# Patient Record
Sex: Female | Born: 2000 | Race: Asian | Hispanic: No | Marital: Single | Smoking: Never smoker
Health system: Southern US, Community
[De-identification: ages and names within clinical notes are randomized; demographics above are authoritative.]

## PROBLEM LIST (undated history)

## (undated) DIAGNOSIS — Z789 Other specified health status: Secondary | ICD-10-CM

## (undated) HISTORY — PX: NO PAST SURGERIES: SHX2092

---
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
## 2022-01-05 ENCOUNTER — Telehealth (HOSPITAL_COMMUNITY): Payer: Self-pay

## 2022-01-05 ENCOUNTER — Other Ambulatory Visit: Payer: Self-pay

## 2022-01-05 ENCOUNTER — Emergency Department (HOSPITAL_BASED_OUTPATIENT_CLINIC_OR_DEPARTMENT_OTHER)
Admission: EM | Admit: 2022-01-05 | Discharge: 2022-01-05 | Disposition: A | Discharge status: Home / Self Care | Payer: Medicaid Other | Attending: Emergency Medicine | Admitting: Emergency Medicine

## 2022-01-05 ENCOUNTER — Encounter (HOSPITAL_BASED_OUTPATIENT_CLINIC_OR_DEPARTMENT_OTHER): Payer: Self-pay | Admitting: Emergency Medicine

## 2022-01-05 DIAGNOSIS — G510 Bell's palsy: Secondary | ICD-10-CM | POA: Insufficient documentation

## 2022-01-05 DIAGNOSIS — R29810 Facial weakness: Secondary | ICD-10-CM | POA: Diagnosis present

## 2022-01-05 MED ORDER — ERYTHROMYCIN 5 MG/GM OP OINT: TOPICAL_OINTMENT | OPHTHALMIC | 0 refills | Status: AC

## 2022-01-05 MED ORDER — VALACYCLOVIR HCL 1 G PO TABS: 1000.0000 mg | ORAL_TABLET | Freq: Two times a day (BID) | ORAL | 0 refills | Status: AC

## 2022-01-05 MED ORDER — PREDNISONE 50 MG PO TABS
60.0000 mg | ORAL_TABLET | Freq: Once | ORAL | Status: AC
  Administered 2022-01-05: 60 mg via ORAL
  Filled 2022-01-05: qty 1

## 2022-01-05 MED ORDER — VALACYCLOVIR HCL 500 MG PO TABS
1000.0000 mg | ORAL_TABLET | ORAL | Status: AC
  Administered 2022-01-05: 1000 mg via ORAL
  Filled 2022-01-05: qty 2

## 2022-01-05 MED ORDER — PREDNISONE 50 MG PO TABS: ORAL_TABLET | ORAL | 0 refills | Status: AC

## 2022-01-05 NOTE — ED Provider Notes (Addendum)
[facility] EMERG[facility] DEPT Provider Note   CSN: [id number] Arrival date & time: 01/05/22  0026     History Chief Complaint  Patient presents with   Facial Droop    Debbie Mccormick is a 21 y.o. female.  The history is provided by the patient.  Illness Location:  Left face Quality:  Droop and unable to raise left eyebrow Severity:  Moderate Onset quality:  Sudden Duration:  1 week Timing:  Constant Progression:  Unchanged Chronicity:  New Context:  Recently had a baby Relieved by:  Nothing Worsened by:  Nothing Ineffective treatments:  None tried Associated symptoms: no abdominal pain, no chest pain, no congestion, no cough, no diarrhea, no ear pain, no fever, no headaches, no loss of consciousness, no myalgias, no nausea, no rhinorrhea, no shortness of breath, no sore throat and no vomiting   Risk factors:  None    Past Medical History:  Diagnosis Date   Medical history non-contributory      Home Medications Prior to Admission medications   Medication Sig Start Date End Date Taking? Authorizing Provider  predniSONE (DELTASONE) 50 MG tablet 1 tablet each days x 6 days 01/05/22  Yes Shannen Flansburg, MD  valACYclovir (VALTREX) 1000 MG tablet Take 1 tablet (1,000 mg total) by mouth 2 (two) times daily. 01/05/22  Yes Seraphina Mitchner, MD  albuterol (PROVENTIL) (2.5 MG/3ML) 0.083% nebulizer solution Take 3 mLs (2.5 mg total) by nebulization every 6 (six) hours. 10/30/21   Holshouser, Theone Murdoch, CNM  ibuprofen (ADVIL) 600 MG tablet Take 1 tablet (600 mg total) by mouth every 6 (six) hours. 12/24/21   Holshouser, Theone Murdoch, CNM  oxyCODONE-acetaminophen (PERCOCET/ROXICET) 5-325 MG tablet Take 1-2 tablets by mouth every 4 (four) hours as needed for severe pain. 12/24/21   Holshouser, Theone Murdoch, CNM  Prenatal Vit-Fe Fumarate-FA (PRENATAL VITAMINS) 28-0.8 MG TABS Take 1 tablet by mouth daily. 04/24/21   [provider]      Allergies    Patient has no known  allergies.    Review of Systems   Review of Systems  Constitutional:  Negative for fever.  HENT:  Negative for congestion, drooling, ear pain, facial swelling, rhinorrhea and sore throat.   Eyes:  Negative for photophobia and visual disturbance.  Respiratory:  Negative for cough and shortness of breath.   Cardiovascular:  Negative for chest pain.  Gastrointestinal:  Negative for abdominal pain, diarrhea, nausea and vomiting.  Musculoskeletal:  Negative for arthralgias and myalgias.  Neurological:  Positive for facial asymmetry. Negative for dizziness, loss of consciousness and headaches.  Psychiatric/Behavioral:  Negative for agitation.   All other systems reviewed and are negative.  Physical Exam Updated Vital Signs BP 129/76 (BP Location: Left Arm)    Pulse 82    Temp 98.3 F (36.8 C) (Oral)    Resp 18    Ht 5\' 2"  (1.575 m)    Wt 73.5 kg    LMP  (LMP Unknown)    SpO2 100%    Breastfeeding No    BMI 29.63 kg/m  Physical Exam Vitals and nursing note reviewed.  Constitutional:      Appearance: Normal appearance.  HENT:     Head: Normocephalic.     Nose: Nose normal.     Mouth/Throat:     Mouth: Mucous membranes are moist.     Pharynx: Oropharynx is clear.  Eyes:     Extraocular Movements: Extraocular movements intact.     Conjunctiva/sclera: Conjunctivae normal.     Pupils:  Pupils are equal, round, and reactive to light.  Cardiovascular:     Rate and Rhythm: Normal rate and regular rhythm.     Pulses: Normal pulses.     Heart sounds: Normal heart sounds.  Pulmonary:     Effort: Pulmonary effort is normal.     Breath sounds: Normal breath sounds.  Abdominal:     General: Abdomen is flat. Bowel sounds are normal.     Palpations: Abdomen is soft.     Tenderness: There is no abdominal tenderness. There is no guarding.  Musculoskeletal:        General: Normal range of motion.     Cervical back: Normal range of motion and neck supple.  Skin:    General: Skin is warm and dry.      Capillary Refill: Capillary refill takes less than 2 seconds.  Neurological:     Mental Status: She is alert and oriented to person, place, and time.     Motor: No weakness.     Gait: Gait normal.     Deep Tendon Reflexes: Reflexes normal.     Comments: Peripheral nerve 7 palsy, droop of the mouth on left, inability to tightly close eye on left unable to raise left eyebrow or wrinkle left forehead at all all other cranial nerves are normal   Psychiatric:        Mood and Affect: Mood normal.        Behavior: Behavior normal.    ED Results / Procedures / Treatments   Labs (all labs ordered are listed, but only abnormal results are displayed) Labs Reviewed - No data to display  EKG None  Radiology No results found.   Medications Ordered in ED Medications  predniSONE (DELTASONE) tablet 60 mg (has no administration in time range)  valACYclovir (VALTREX) tablet 1,000 mg (has no administration in time range)    ED Course/ Medical Decision Making/ A&P                           Medical Decision Making Patient with left facial droop and inability to raise left eyebrow.    Risk Prescription drug management. Decision regarding hospitalization. Risk Details: I considered hospitalization and CT scan when I saw the complaint but I examined the patient and the patient has no ability to raise her left eyebrow and no ability to wrinkle her forehead on the left, this is indicated of a peripheral nerve 7 palsy and not central as would be seen in stroke.  He exam is consistent with Bells palsy.  She has no additional signs or symptoms or stroke. She has no pain or headachek nor neck pain to suggest alternative etiology.  EOMI are intact.  BP is normal.  Patient is not diabetic.  I will start Valtrex, steroids and rewetting drops, emycin ointment and have patient follow up with both her PMD and ophthalmology to ensure eyelid symptoms resolve.      Final Clinical Impression(s) / ED  Diagnoses Final diagnoses:  Bell's palsy  Return for intractable cough, coughing up blood, fevers > 100.4 unrelieved by medication, shortness of breath, intractable vomiting, chest pain, shortness of breath, weakness, numbness, changes in speech, facial asymmetry, abdominal pain, passing out, Inability to tolerate liquids or food, cough, altered mental status or any concerns. No signs of systemic illness or infection. The patient is nontoxic-appearing on exam and vital signs are within normal limits.  I have reviewed the triage vital signs  and the nursing notes. Pertinent labs & imaging results that were available during my care of the patient were reviewed by me and considered in my medical decision making (see chart for details). After history, exam, and medical workup I feel the patient has been appropriately medically screened and is safe for discharge home. Pertinent diagnoses were discussed with the patient. Patient was given return precautions.    Rx / DC Orders ED Discharge Orders          Ordered    predniSONE (DELTASONE) 50 MG tablet        01/05/22 0346    valACYclovir (VALTREX) 1000 MG tablet  2 times daily        01/05/22 0346                Keamber Macfadden, MD 01/05/22 [id number]

## 2022-01-05 NOTE — ED Triage Notes (Signed)
°  Patient comes in with L sided facial droop that started a week ago.  Patient is two weeks postpartum and received epidural.  No other neurological symptoms other than facial droop.  Patient states she has no pain and only eye irritation from not being able to blink completely with L eye.

## 2022-01-05 NOTE — Telephone Encounter (Signed)
"  I have been doing great. I went to the ER yesterday. The said I am having a nerve problem in my face. The doctor said that I am ok and not to worry." RN told patient to follow treatment recommendations from the doctor and if symptoms are to [facility], or if she has concerns to reach out to her provider or come to hospital to be seen again. "My dressing is off of my c-section incision." Patient declines having incision check with her OB yet. RN told patient to call OB office and schedule incision check as her dc note from delivery says they would like to see her for a 1 wk pp incision check. RN also reviewed incision care and signs of infection to report to the provider. Patient has other concerns or questions.  "He's great. He is doing well. Eating and gaining weight. He sleeps in a crib." RN reviewed ABC's of safe sleep with patient. Patient declines any questions or concerns about baby.  EPDS score is 2.  Marcelino Duster [facility] 01/05/2022,1158

## 2022-05-29 IMAGING — DX DG CHEST 1V PORT
1 series · 1 of 1 positions shown · non-contrast
Comparison: None.

CLINICAL DATA: Shortness of breath, cough, fever.

EXAM:
PORTABLE CHEST 1 VIEW

[chest]
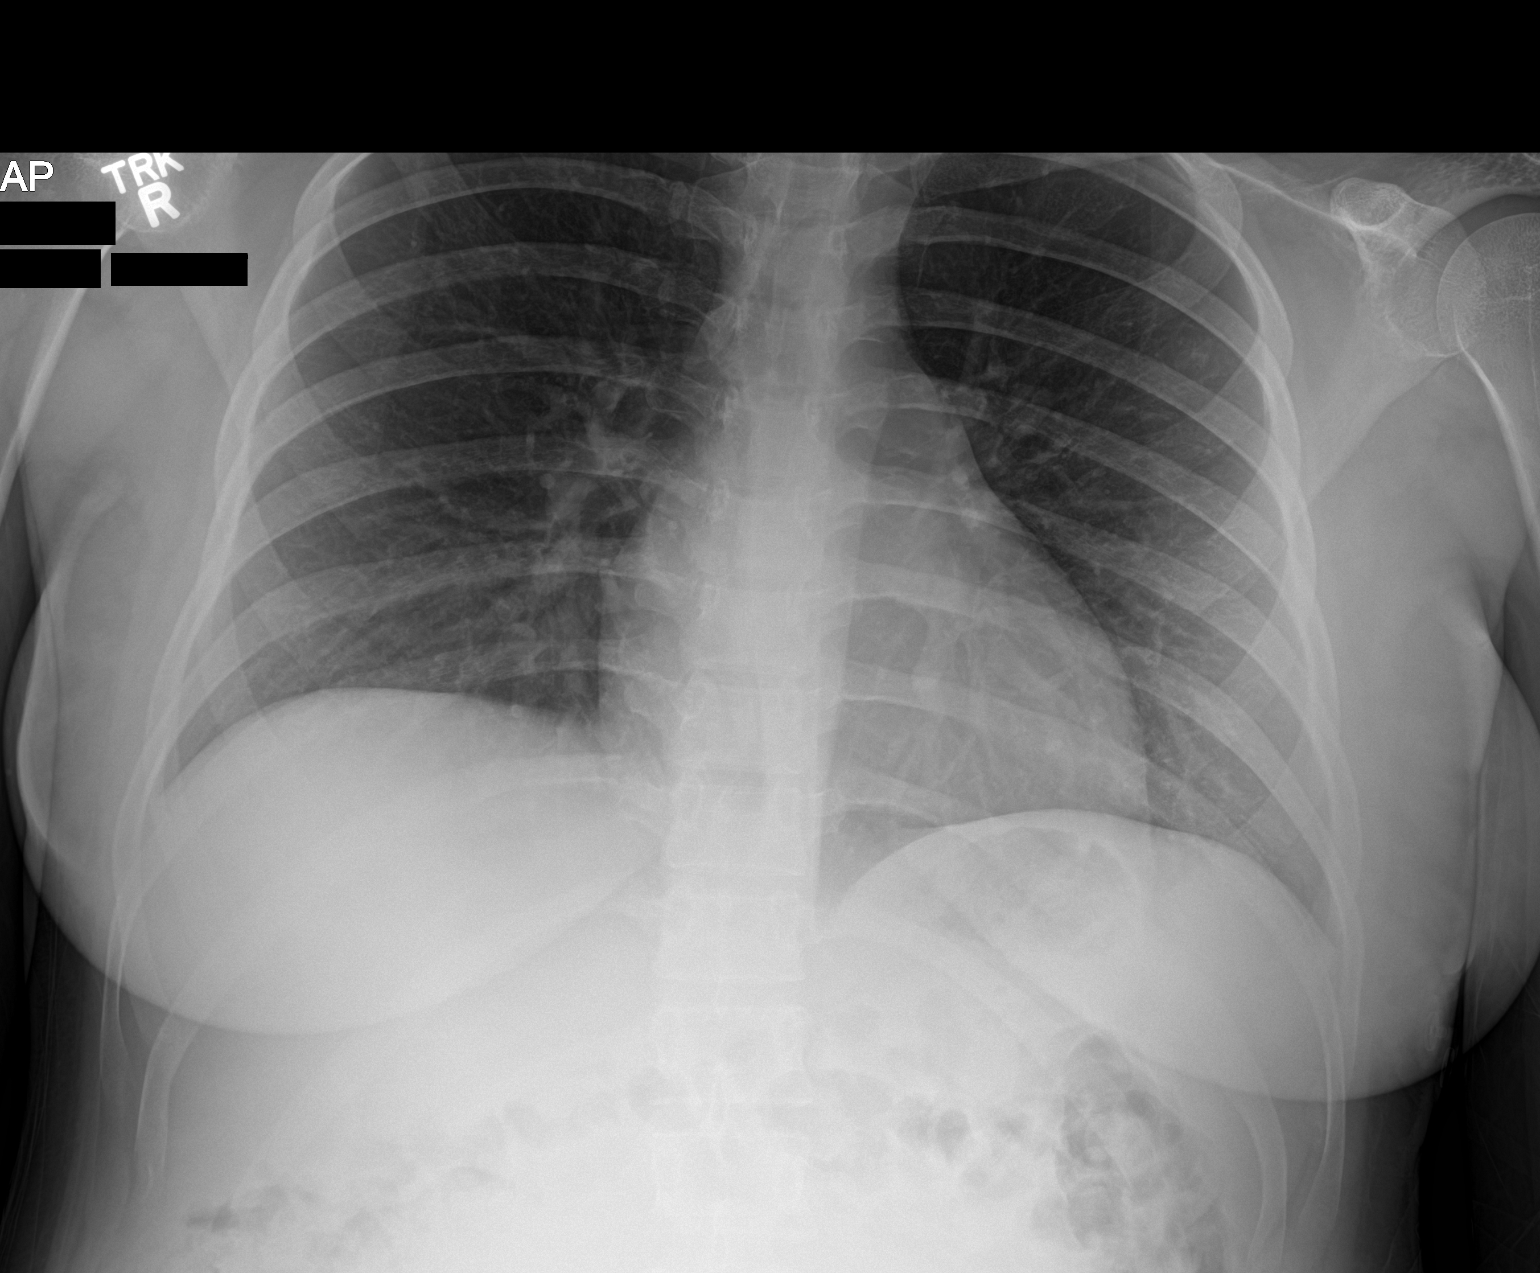

[1 of 1 positions shown; findings below may reference images not displayed]

FINDINGS: The heart size and mediastinal contours are within normal limits.
Both lungs are clear. The visualized skeletal structures are
unremarkable.
IMPRESSION: No active disease.

## 2022-10-01 ENCOUNTER — Other Ambulatory Visit: Payer: Self-pay | Admitting: Internal Medicine

## 2022-10-02 LAB — IRON, TOTAL/TOTAL IRON BINDING CAP
%SAT: 11 % (calc) — ABNORMAL LOW (ref 15–45)
Iron: 38 ug/dL (ref 27–164)
TIBC: 335 mcg/dL (calc) (ref 271–448)

## 2022-10-02 LAB — BASIC METABOLIC PANEL WITH GFR
BUN: 12 mg/dL (ref 7–20)
CO2: 22 mmol/L (ref 20–32)
Calcium: 9.3 mg/dL (ref 8.9–10.4)
Chloride: 106 mmol/L (ref 98–110)
Creat: 0.61 mg/dL (ref 0.50–0.96)
Glucose, Bld: 90 mg/dL (ref 65–99)
Potassium: 4.2 mmol/L (ref 3.8–5.1)
Sodium: 139 mmol/L (ref 135–146)
eGFR: 132 mL/min/{1.73_m2} (ref >=60)

## 2022-10-02 LAB — VITAMIN B12: Vitamin B-12: 727 pg/mL (ref 200–1100)

## 2022-10-02 LAB — FOLATE: Folate: 17.4 ng/mL

## 2022-10-02 LAB — CBC
HCT: 38.6 % (ref 35.0–45.0)
Hemoglobin: 12.6 g/dL (ref 11.7–15.5)
MCH: 25.6 pg — ABNORMAL LOW (ref 27.0–33.0)
MCHC: 32.6 g/dL (ref 32.0–36.0)
MCV: 78.3 fL — ABNORMAL LOW (ref 80.0–100.0)
MPV: 11.1 fL (ref 7.5–12.5)
Platelets: 382 10*3/uL (ref 140–400)
RBC: 4.93 10*6/uL (ref 3.80–5.10)
RDW: 13.1 % (ref 11.0–15.0)
WBC: 6 10*3/uL (ref 3.8–10.8)

## 2022-10-02 LAB — VITAMIN D 25 HYDROXY (VIT D DEFICIENCY, FRACTURES): Vit D, 25-Hydroxy: 5 ng/mL — ABNORMAL LOW (ref 30–100)

## 2022-10-02 LAB — SICKLE CELL SCREEN: Sickle Solubility Test - HGBRFX: NEGATIVE

## 2022-10-02 LAB — FERRITIN: Ferritin: 24 ng/mL (ref 16–154)

## 2023-01-09 ENCOUNTER — Encounter: Payer: Self-pay | Admitting: Gastroenterology

## 2023-02-14 ENCOUNTER — Other Ambulatory Visit: Payer: Medicaid Other

## 2023-02-14 ENCOUNTER — Ambulatory Visit (INDEPENDENT_AMBULATORY_CARE_PROVIDER_SITE_OTHER): Payer: Medicaid Other | Admitting: Gastroenterology

## 2023-02-14 ENCOUNTER — Encounter: Payer: Self-pay | Admitting: Gastroenterology

## 2023-02-14 VITALS — BP 118/76 | HR 80 | Ht 63.0 in | Wt 139.0 lb

## 2023-02-14 DIAGNOSIS — R140 Abdominal distension (gaseous): Secondary | ICD-10-CM

## 2023-02-14 DIAGNOSIS — R1012 Left upper quadrant pain: Secondary | ICD-10-CM

## 2023-02-14 NOTE — Patient Instructions (Addendum)
_______________________________________________________  If your blood pressure at your visit was 140/90 or greater, please contact your primary care physician to follow up on this.  _______________________________________________________  If you are age 20 or older, your body mass index should be between 23-30. Your Body mass index is 24.62 kg/m. If this is out of the aforementioned range listed, please consider follow up with your Primary Care Provider.  If you are age 4 or younger, your body mass index should be between 19-25. Your Body mass index is 24.62 kg/m. If this is out of the aformentioned range listed, please consider follow up with your Primary Care Provider.   Your provider has requested that you go to the basement level for lab work before leaving today. Press "B" on the elevator. The lab is located at the first door on the left as you exit the elevator.  Please purchase Metamucil over the counter. Take as directed.   ________________________________________________________  The [facility] providers would like to encourage you to use [facility] to communicate with providers for non-urgent requests or questions.  Due to long hold times on the telephone, sending your provider a message by [facility] may be a faster and more efficient way to get a response.  Please allow 48 business hours for a response.  Please remember that this is for non-urgent requests.   It was a pleasure to see you today!  Thank you for trusting me with your gastrointestinal care!    Scott E.Candis Schatz, MD

## 2023-02-14 NOTE — Progress Notes (Unsigned)
HPI : Debbie Mccormick is a very pleasant 22 year old female with no chronic medical history who is referred to [place] by Dr. Nolene Ebbs for further evaluation of abdominal discomfort and bloating.  She has been having these symptoms for several months now.  She describes pain in her upper abdomen and significant bloating and distention.  The pain localizes more to the left upper quadrant and is described as a pressure-like sensation which can last for hours.  It is experienced several days per week. No nausea or vomiting.  No heartburn, acid regurgitation  or dysphagia.   Her weight is stable.   These symptoms are typically noticed after eating and she has noticed bread often makes her symptoms; otherwise she denies any other foods causing worse symptoms.  She typically has regular bowel movements with formed brown stools but sometimes has constipation She takes an OTC laxative called  Colon Cleanse tablet as needed which helps with constipation.  She does not take any other prescriptions or OTC medications.  No blood in her stool.  She denies any changes in her diet preceding her symptoms.  No recent antibiotics.  No NSAIDs.  A faxed clinic note from her referring provider references a normal ultrasound, but the patient states that this ultrasound was several years ago.  No previous H. Pylori testing or treatment.  Her MCV was slightly low with low iron saturation, but normal Hgb and ferritin. She denies any family history of chronic GI conditions such as celiac disease.   Past Medical History:  Diagnosis Date   Medical history non-contributory      Past Surgical History:  Procedure Laterality Date   CESAREAN SECTION N/A 12/22/2021   Procedure: CESAREAN SECTION;  Surgeon: Sanjuana Kava, MD;  Location: [facility] LD ORS;  Service: Obstetrics;  Laterality: N/A;   NO PAST SURGERIES     Family History  Problem Relation Age of Onset   Healthy Mother    Healthy Father    Liver disease Neg Hx     Esophageal cancer Neg Hx    Colon cancer Neg Hx    Social History   Tobacco Use   Smoking status: Never   Smokeless tobacco: Never  Vaping Use   Vaping Use: Never used  Substance Use Topics   Alcohol use: No   Drug use: No   Current Outpatient Medications  Medication Sig Dispense Refill   albuterol (PROVENTIL) (2.5 MG/3ML) 0.083% nebulizer solution Take 3 mLs (2.5 mg total) by nebulization every 6 (six) hours. 75 mL 0   erythromycin ophthalmic ointment Place a 1/2 inch ribbon of ointment into the lower eyelid BID 3.5 g 0   ibuprofen (ADVIL) 600 MG tablet Take 1 tablet (600 mg total) by mouth every 6 (six) hours. 30 tablet 0   predniSONE (DELTASONE) 50 MG tablet 1 tablet each days x 6 days 6 tablet 0   Prenatal Vit-Fe Fumarate-FA (PRENATAL VITAMINS) 28-0.8 MG TABS Take 1 tablet by mouth daily.     valACYclovir (VALTREX) 1000 MG tablet Take 1 tablet (1,000 mg total) by mouth 2 (two) times daily. 14 tablet 0   oxyCODONE-acetaminophen (PERCOCET/ROXICET) 5-325 MG tablet Take 1-2 tablets by mouth every 4 (four) hours as needed for severe pain. (Patient not taking: Reported on 02/14/2023) 30 tablet 0   No current facility-administered medications for this visit.   No Known Allergies   Review of Systems: All systems reviewed and negative except where noted in HPI.    No results found.  Physical Exam: BP 118/76   Pulse 80   Ht 5\' 3"  (1.6 m)   Wt 139 lb (63 kg)   SpO2 95%   BMI 24.62 kg/m  Constitutional: Pleasant,well-developed, [place] female in no acute distress.  Accompanied by husband and infant son HEENT: Normocephalic and atraumatic. Conjunctivae are normal. No scleral icterus. Cardiovascular: Normal rate, regular rhythm.  Pulmonary/chest: Effort normal and breath sounds normal. No wheezing, rales or rhonchi. Abdominal: Soft, nondistended, multifocal tenderness to palpation in the epigastrium, LUQ and LLQ without rigidity or guarding. Bowel sounds active throughout.  There are no masses palpable. No hepatomegaly. Extremities: no edema Neurological: Alert and oriented to person place and time. Skin: Skin is warm and dry. No rashes noted. Psychiatric: Normal mood and affect. Behavior is normal.  CBC    Component Value Date/Time   WBC 6.0 10/01/2022 1608   RBC 4.93 10/01/2022 1608   HGB 12.6 10/01/2022 1608   HCT 38.6 10/01/2022 1608   PLT 382 10/01/2022 1608   MCV 78.3 (L) 10/01/2022 1608   MCH 25.6 (L) 10/01/2022 1608   MCHC 32.6 10/01/2022 1608   RDW 13.1 10/01/2022 1608   LYMPHSABS 0.8 10/28/2021 0824   MONOABS 0.9 10/28/2021 0824   EOSABS 0.0 10/28/2021 0824   BASOSABS 0.0 10/28/2021 0824    CMP     Component Value Date/Time   NA 139 10/01/2022 1608   K 4.2 10/01/2022 1608   CL 106 10/01/2022 1608   CO2 22 10/01/2022 1608   GLUCOSE 90 10/01/2022 1608   BUN 12 10/01/2022 1608   CREATININE 0.61 10/01/2022 1608   CALCIUM 9.3 10/01/2022 1608   PROT 5.2 (L) 10/29/2021 0755   ALBUMIN 2.4 (L) 10/29/2021 0755   AST 37 10/29/2021 0755   ALT 23 10/29/2021 0755   ALKPHOS 104 10/29/2021 0755   BILITOT 0.5 10/29/2021 0755   GFRNONAA >60 10/29/2021 0755     ASSESSMENT AND PLAN:  22 year old female with several months of bloating and post prandial pain with occasional constipation.  She notes that bread typically worsens her symptoms.    No red flag symptoms, no family history.  Will check for H. Pylori and celiac disease.  Recommended daily fiber supplement to improve stool bulk and reduce constipation which can sometimes improve bloating.  Provided samples of IBGard.  We discussed the pathophysiology of intestinal gas as well as gut-brain axis disorders. If H. Pylori and celiac serologies negative and symptoms not improving with fiber, will discuss further testing to include upper endoscopy vs empiric treatments for functional bloating/dyspepsia.  Upper abdominal pain/bloating - H. Pylori stool antigen - TTG/IgA - IBGard, daily  Metamucil - Follow up 6-8 weeks  Brande Uncapher E. Candis Schatz, MD [facility]   CC:  Nolene Ebbs, MD

## 2023-02-15 LAB — TISSUE TRANSGLUTAMINASE, IGA: (tTG) Ab, IgA: <1 U/mL

## 2023-02-15 LAB — IGA: Immunoglobulin A: 163 mg/dL (ref 47–310)

## 2023-02-19 NOTE — Progress Notes (Signed)
[place],  Can you please let Debbie Mccormick know that her celiac test was normal.  Awaiting h. Pylori stool test results

## 2023-04-24 ENCOUNTER — Ambulatory Visit: Payer: Medicaid Other | Admitting: Gastroenterology

## 2023-06-25 ENCOUNTER — Other Ambulatory Visit: Payer: Self-pay | Admitting: Internal Medicine

## 2023-07-22 ENCOUNTER — Ambulatory Visit: Payer: Medicaid Other | Admitting: Gastroenterology

## 2023-10-24 ENCOUNTER — Ambulatory Visit: Payer: Medicaid Other | Admitting: Gastroenterology

## 2023-10-24 NOTE — Progress Notes (Deleted)
HPI : Debbie Mccormick is a 22 y.o. female who I initially saw in March of this year for further evaluation of chronic symptoms of bloating, postprandial abdominal discomfort and constipation.  Celiac serology was negative, H. pylori stool antigen was ordered, but sample was never submitted by the patient.  She was advised to start taking Metamucil on a daily basis to help with the constipation, which was thought to help with her pain and bloating.  IBgard was recommended as well, with plans for further evaluation and other treatment options if her symptoms did not improve.    Past Medical History:  Diagnosis Date   Medical history non-contributory      Past Surgical History:  Procedure Laterality Date   CESAREAN SECTION N/A 12/22/2021   Procedure: CESAREAN SECTION;  Surgeon: Essie Hart, MD;  Location: [facility] LD [facility]S;  Service: Obstetrics;  Laterality: N/A;   NO PAST SURGERIES     Family History  Problem Relation Age of Onset   Healthy Mother    Healthy Father    Liver disease Neg Hx    Esophageal cancer Neg Hx    Colon cancer Neg Hx    Social History   Tobacco Use   Smoking status: Never   Smokeless tobacco: Never  Vaping Use   Vaping status: Never Used  Substance Use Topics   Alcohol use: No   Drug use: No   Current Outpatient Medications  Medication Sig Dispense Refill   albuterol (PROVENTIL) (2.5 MG/3ML) 0.083% nebulizer solution Take 3 mLs (2.5 mg total) by nebulization every 6 (six) hours. 75 mL 0   erythromycin ophthalmic ointment Place a 1/2 inch ribbon of ointment into the lower eyelid BID 3.5 g 0   ibuprofen (ADVIL) 600 MG tablet Take 1 tablet (600 mg total) by mouth every 6 (six) hours. 30 tablet 0   oxyCODONE-acetaminophen (PERCOCET/ROXICET) 5-325 MG tablet Take 1-2 tablets by mouth every 4 (four) hours as needed for severe pain. (Patient not taking: Reported on 02/14/2023) 30 tablet 0   predniSONE (DELTASONE) 50 MG tablet 1 tablet each days x 6 days 6 tablet 0    Prenatal Vit-Fe Fumarate-FA (PRENATAL VITAMINS) 28-0.8 MG TABS Take 1 tablet by mouth daily.     valACYclovir (VALTREX) 1000 MG tablet Take 1 tablet (1,000 mg total) by mouth 2 (two) times daily. 14 tablet 0   No current facility-administered medications for this visit.   No Known Allergies   Review of Systems: All systems reviewed and negative except where noted in HPI.    No results found.  Physical Exam: There were no vitals taken for this visit. Constitutional: Pleasant,well-developed, ***female in no acute distress. HEENT: Normocephalic and atraumatic. Conjunctivae are normal. No scleral icterus. Neck supple.  Cardiovascular: Normal rate, regular rhythm.  Pulmonary/chest: Effort normal and breath sounds normal. No wheezing, rales or rhonchi. Abdominal: Soft, nondistended, nontender. Bowel sounds active throughout. There are no masses palpable. No hepatomegaly. Extremities: no edema Lymphadenopathy: No cervical adenopathy noted. Neurological: Alert and oriented to person place and time. Skin: Skin is warm and dry. No rashes noted. Psychiatric: Normal mood and affect. Behavior is normal.  CBC    Component Value Date/Time   WBC 5.7 06/25/2023 0000   RBC 5.03 06/25/2023 0000   HGB 12.3 06/25/2023 0000   HCT 38.9 06/25/2023 0000   PLT 384 06/25/2023 0000   MCV 77.3 (L) 06/25/2023 0000   MCH 24.5 (L) 06/25/2023 0000   MCHC 31.6 (L) 06/25/2023 0000   RDW  13.0 06/25/2023 0000   LYMPHSABS 0.8 10/28/2021 0824   MONOABS 0.9 10/28/2021 0824   EOSABS 0.0 10/28/2021 0824   BASOSABS 0.0 10/28/2021 0824    CMP     Component Value Date/Time   NA 136 06/25/2023 0000   K 4.4 06/25/2023 0000   CL 104 06/25/2023 0000   CO2 22 06/25/2023 0000   GLUCOSE 87 06/25/2023 0000   BUN 12 06/25/2023 0000   CREATININE 0.58 06/25/2023 0000   CALCIUM 9.1 06/25/2023 0000   PROT 7.1 06/25/2023 0000   ALBUMIN 2.4 (L) 10/29/2021 0755   AST 23 06/25/2023 0000   ALT 22 06/25/2023 0000    ALKPHOS 104 10/29/2021 0755   BILITOT 0.4 06/25/2023 0000   GFRNONAA >60 10/29/2021 0755       Latest Ref Rng & Units 06/25/2023   12:00 AM 10/01/2022    4:08 PM 12/23/2021    1:44 PM  CBC EXTENDED  WBC 3.8 - 10.8 Thousand/uL 5.7  6.0    RBC 3.80 - 5.10 Million/uL 5.03  4.93    Hemoglobin 11.7 - 15.5 g/dL 82.9  56.2  8.4   HCT 13.0 - 45.0 % 38.9  38.6  25.5   Platelets 140 - 400 Thousand/uL 384  382        ASSESSMENT AND PLAN:  Fleet Contras, MD
